# Patient Record
Sex: Female | Born: 1995 | Race: Black or African American | Hispanic: No | Marital: Single | Smoking: Never smoker
Health system: Southern US, Community
[De-identification: ages and names within clinical notes are randomized; demographics above are authoritative.]

---
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
## 2016-01-06 ENCOUNTER — Emergency Department (HOSPITAL_COMMUNITY)
Admission: EM | Admit: 2016-01-06 | Discharge: 2016-01-06 | Disposition: A | Discharge status: Home / Self Care | Payer: Medicaid Other | Attending: Emergency Medicine | Admitting: Emergency Medicine

## 2016-01-06 ENCOUNTER — Encounter (HOSPITAL_COMMUNITY): Payer: Self-pay | Admitting: Emergency Medicine

## 2016-01-06 ENCOUNTER — Emergency Department (HOSPITAL_COMMUNITY): Payer: Medicaid Other

## 2016-01-06 DIAGNOSIS — R05 Cough: Secondary | ICD-10-CM | POA: Diagnosis present

## 2016-01-06 DIAGNOSIS — Z793 Long term (current) use of hormonal contraceptives: Secondary | ICD-10-CM | POA: Insufficient documentation

## 2016-01-06 DIAGNOSIS — J069 Acute upper respiratory infection, unspecified: Secondary | ICD-10-CM

## 2016-01-06 DIAGNOSIS — B9789 Other viral agents as the cause of diseases classified elsewhere: Secondary | ICD-10-CM

## 2016-01-06 LAB — RAPID STREP SCREEN (MED CTR MEBANE ONLY): Streptococcus, Group A Screen (Direct): NEGATIVE

## 2016-01-06 MED ORDER — GUAIFENESIN ER 1200 MG PO TB12: 1.0000 | ORAL_TABLET | Freq: Two times a day (BID) | ORAL | Status: DC

## 2016-01-06 MED ORDER — PROMETHAZINE-DM 6.25-15 MG/5ML PO SYRP
5.0000 mL | ORAL_SOLUTION | Freq: Four times a day (QID) | ORAL | Status: DC | PRN
Start: 2016-01-06 — End: 2021-04-20

## 2016-01-06 MED ORDER — IBUPROFEN 800 MG PO TABS: 800.0000 mg | ORAL_TABLET | Freq: Three times a day (TID) | ORAL | Status: DC | PRN

## 2016-01-06 NOTE — ED Provider Notes (Signed)
CSN: [id number]     Arrival date & time 01/06/16  1626 History   By signing my name below, I, Lorenza Chick, attest that this documentation has been prepared under the direction and in the presence of Ebbie Ridge, PA-C  Electronically Signed: Lorenza Chick, ED Scribe. 01/06/2016. 5:15 PM.  Chief Complaint  Patient presents with  . Sore Throat  . Cough  . Otalgia   The history is provided by the patient. No language interpreter was used.    HPI Comments: Amy Richard is a 20 y.o. female who presents to the Emergency Department complaining of a productive cough with green sputum worse over the last week. Associated symptoms are chest tightness, congestion, rhinorrhea, a feeling of ear fullness and sore throat. Her pain is worse when swallowing . Pt states that she began having similar symptoms two weeks ago but symptoms improved with robitussin and motrin. She also notes that her symptoms began to worsen again earlier this week following a trip to [place]. Pt denies vomiting, nausea, chest pain and palpitations.  History reviewed. No pertinent past medical history. History reviewed. No pertinent past surgical history. No family history on file. Social History  Substance Use Topics  . Smoking status: Never Smoker   . Smokeless tobacco: None  . Alcohol Use: No   OB History    No data available     Review of Systems  A complete 10 system review of systems was obtained and all systems are negative except as noted in the HPI and PMH.    Allergies  Review of patient's allergies indicates no known allergies.  Home Medications   Prior to Admission medications   Medication Sig Start Date End Date Taking? Authorizing Provider  cetirizine (ZYRTEC) 10 MG tablet Take 1 tablet (10 mg total) by mouth daily. 10/31/13 12/29/13  Truddie Coco, DO  norgestimate-ethinyl estradiol (SPRINTEC 28) 0.25-35 MG-MCG tablet Take 1 tablet by mouth daily.    Historical Provider, MD   BP 132/79 mmHg  Pulse  80  Temp(Src) 98.2 F (36.8 C) (Oral)  Resp 16  SpO2 100%  LMP 12/25/2015 Physical Exam  Constitutional: She is oriented to person, place, and time. She appears well-developed and well-nourished. No distress.  HENT:  Head: Normocephalic and atraumatic.  Mouth/Throat: Oropharynx is clear and moist. No oropharyngeal exudate.  Eyes: Conjunctivae and EOM are normal.  Neck: Neck supple. No tracheal deviation present.  Cardiovascular: Normal rate, regular rhythm and normal heart sounds.   Pulmonary/Chest: Effort normal and breath sounds normal.  Abdominal: Soft. There is no tenderness. There is no rebound, no guarding and no CVA tenderness.  Musculoskeletal: Normal range of motion.  Neurological: She is alert and oriented to person, place, and time.  Skin: Skin is warm and dry.  Psychiatric: She has a normal mood and affect. Her behavior is normal.  Nursing note and vitals reviewed.   ED Course  Procedures (including critical care time)  DIAGNOSTIC STUDIES: Oxygen Saturation is 100% on RA, noraml by my interpretation.    COORDINATION OF CARE: 4:41 PM-Discussed treatment plan with pt at bedside and pt agreed to plan.   Labs Review Labs Reviewed  RAPID STREP SCREEN (NOT AT [facility])  CULTURE, GROUP A STREP [facility])    Imaging Review Dg Chest 2 View  01/06/2016  CLINICAL DATA:  Central chest pain, heart productive cough with green and bloody mucus, and shortness of breath for 1 week EXAM: CHEST  2 VIEW COMPARISON:  None FINDINGS: Normal heart  size, mediastinal contours and pulmonary vascularity. Lungs clear. No pleural effusion or pneumothorax. Bones unremarkable. IMPRESSION: No acute abnormalities. Electronically Signed   By: Ulyses SouthwardMark  Boles M.D.   On: 01/06/2016 17:31   I have personally reviewed and evaluated these images and lab results as part of my medical decision-making.   MDM  Pt symptoms consistent with URI. CXR negative for acute infiltrate. Pt will be discharged with symptomatic  treatment.  Discussed return precautions.  Pt is hemodynamically stable & in NAD prior to discharge.  Final diagnoses:  None      Charlestine NightChristopher Candida Vetter, PA-C 01/06/16 1737  Geoffery Lyonsouglas Delo, MD 01/06/16 2148

## 2016-01-06 NOTE — Discharge Instructions (Signed)
Return here as needed.  Follow-up with your primary care doctor.  The x-rays did not show any abnormality and your strep test was negative.  Increase her fluid intake and rest as much as possible

## 2016-01-06 NOTE — ED Notes (Signed)
Pt c/o sore throat, cough, and bilateral otalgia x 2 weeks. Pt sts she thought she was getting better but then began to have a productive cough. Pt c/o chest pain with coughing, deep breaths, and movement. A&Ox4 and ambulatory. Pt c/o painful swallowing.

## 2016-01-09 LAB — CULTURE, GROUP A STREP (THRC)

## 2016-02-03 ENCOUNTER — Emergency Department (HOSPITAL_COMMUNITY)
Admission: EM | Admit: 2016-02-03 | Discharge: 2016-02-03 | Disposition: A | Discharge status: Home / Self Care | Payer: Medicaid Other | Attending: Emergency Medicine | Admitting: Emergency Medicine

## 2016-02-03 ENCOUNTER — Encounter (HOSPITAL_COMMUNITY): Payer: Self-pay

## 2016-02-03 DIAGNOSIS — S6991XA Unspecified injury of right wrist, hand and finger(s), initial encounter: Secondary | ICD-10-CM | POA: Insufficient documentation

## 2016-02-03 DIAGNOSIS — B9689 Other specified bacterial agents as the cause of diseases classified elsewhere: Secondary | ICD-10-CM

## 2016-02-03 DIAGNOSIS — L293 Anogenital pruritus, unspecified: Secondary | ICD-10-CM | POA: Diagnosis present

## 2016-02-03 DIAGNOSIS — Z79818 Long term (current) use of other agents affecting estrogen receptors and estrogen levels: Secondary | ICD-10-CM | POA: Diagnosis not present

## 2016-02-03 DIAGNOSIS — B373 Candidiasis of vulva and vagina: Secondary | ICD-10-CM | POA: Insufficient documentation

## 2016-02-03 DIAGNOSIS — Y9389 Activity, other specified: Secondary | ICD-10-CM | POA: Diagnosis not present

## 2016-02-03 DIAGNOSIS — Z3202 Encounter for pregnancy test, result negative: Secondary | ICD-10-CM | POA: Diagnosis not present

## 2016-02-03 DIAGNOSIS — Z79899 Other long term (current) drug therapy: Secondary | ICD-10-CM | POA: Insufficient documentation

## 2016-02-03 DIAGNOSIS — Y9289 Other specified places as the place of occurrence of the external cause: Secondary | ICD-10-CM | POA: Diagnosis not present

## 2016-02-03 DIAGNOSIS — Y998 Other external cause status: Secondary | ICD-10-CM | POA: Diagnosis not present

## 2016-02-03 DIAGNOSIS — N760 Acute vaginitis: Secondary | ICD-10-CM | POA: Diagnosis not present

## 2016-02-03 DIAGNOSIS — B379 Candidiasis, unspecified: Secondary | ICD-10-CM

## 2016-02-03 LAB — URINALYSIS, ROUTINE W REFLEX MICROSCOPIC
Bilirubin Urine: NEGATIVE
Glucose, UA: NEGATIVE mg/dL
Hgb urine dipstick: NEGATIVE
Ketones, ur: NEGATIVE mg/dL
Nitrite: NEGATIVE
Protein, ur: 30 mg/dL — AB
Specific Gravity, Urine: 1.026 (ref 1.005–1.030)
pH: 6.5 (ref 5.0–8.0)

## 2016-02-03 LAB — URINE MICROSCOPIC-ADD ON

## 2016-02-03 LAB — WET PREP, GENITAL
Sperm: NONE SEEN
Trich, Wet Prep: NONE SEEN

## 2016-02-03 LAB — POC URINE PREG, ED: Preg Test, Ur: NEGATIVE

## 2016-02-03 MED ORDER — FLUCONAZOLE 150 MG PO TABS: 150.0000 mg | ORAL_TABLET | Freq: Once | ORAL | Status: DC

## 2016-02-03 MED ORDER — METRONIDAZOLE 500 MG PO TABS: 500.0000 mg | ORAL_TABLET | Freq: Two times a day (BID) | ORAL | Status: DC
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
## 2016-02-03 NOTE — Discharge Instructions (Signed)
Take the prescribed medication as directed.  Do not drink alcohol while taking Flagyl, it will make you very sick. Follow-up with your primary care physician. Return to the ED for new or worsening symptoms.
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
## 2016-02-03 NOTE — ED Notes (Signed)
Pt is unable to give a urine sample 

## 2016-02-06 LAB — GC/CHLAMYDIA PROBE AMP (~~LOC~~) NOT AT ARMC
Chlamydia: NEGATIVE
Neisseria Gonorrhea: NEGATIVE

## 2016-02-25 ENCOUNTER — Encounter (HOSPITAL_COMMUNITY): Payer: Self-pay | Admitting: Emergency Medicine

## 2016-02-25 DIAGNOSIS — Y999 Unspecified external cause status: Secondary | ICD-10-CM | POA: Insufficient documentation

## 2016-02-25 DIAGNOSIS — Y93F2 Activity, caregiving, lifting: Secondary | ICD-10-CM | POA: Diagnosis not present

## 2016-02-25 DIAGNOSIS — X500XXA Overexertion from strenuous movement or load, initial encounter: Secondary | ICD-10-CM | POA: Insufficient documentation

## 2016-02-25 DIAGNOSIS — Y929 Unspecified place or not applicable: Secondary | ICD-10-CM | POA: Diagnosis not present

## 2016-02-25 DIAGNOSIS — S61304A Unspecified open wound of right ring finger with damage to nail, initial encounter: Secondary | ICD-10-CM | POA: Insufficient documentation

## 2016-02-25 DIAGNOSIS — Z792 Long term (current) use of antibiotics: Secondary | ICD-10-CM | POA: Insufficient documentation

## 2016-02-25 NOTE — ED Notes (Addendum)
Pt was here on the 5th for right ring fingernail lifting up. Pt re injured it while lifting a box. Pts nail is lifted from bed. NO bleeding noted.

## 2016-02-26 ENCOUNTER — Emergency Department (HOSPITAL_COMMUNITY)
Admission: EM | Admit: 2016-02-26 | Discharge: 2016-02-26 | Disposition: A | Discharge status: Home / Self Care | Payer: Medicaid Other | Attending: Emergency Medicine | Admitting: Emergency Medicine

## 2016-02-26 DIAGNOSIS — S6981XA Other specified injuries of right wrist, hand and finger(s), initial encounter: Secondary | ICD-10-CM

## 2016-02-26 MED ORDER — LIDOCAINE HCL (PF) 1 % IJ SOLN
5.0000 mL | INTRAMUSCULAR | Status: AC
  Administered 2016-02-26: 5 mL
  Filled 2016-02-26: qty 30

## 2016-02-26 NOTE — ED Notes (Signed)
PA at bedside.

## 2016-02-26 NOTE — ED Provider Notes (Signed)
CSN: [id number]     Arrival date & time 02/25/16  2242 History   First MD Initiated Contact with Patient 02/26/16 0021     Chief Complaint  Patient presents with  . Nail Problem     (Consider location/radiation/quality/duration/timing/severity/associated sxs/prior Treatment) HPI Comments: 20 year old female presents to the emergency department for evaluation of nail bed injury. Patient states that the nail of her right ring finger was loose from an injury on 02/03/2016. She states that she reinjured this nail while lifting a box today causing the nail to lift from her nail bed. She complains of a mild, aching, throbbing pain which has been constant. Pain is worse with palpation to the area. No associated laceration. No medications taken prior to arrival for symptoms.  The history is provided by the patient. No language interpreter was used.    History reviewed. No pertinent past medical history. History reviewed. No pertinent past surgical history. History reviewed. No pertinent family history. Social History  Substance Use Topics  . Smoking status: Never Smoker   . Smokeless tobacco: None  . Alcohol Use: No   OB History    No data available      Review of Systems  Skin: Positive for wound.  Neurological: Negative for weakness and numbness.  All other systems reviewed and are negative.   Allergies  Review of patient's allergies indicates no known allergies.  Home Medications   Prior to Admission medications   Medication Sig Start Date End Date Taking? Authorizing Provider  Norethindrone-Ethinyl Estradiol-Fe Biphas (LO LOESTRIN FE) 1 MG-10 MCG / 10 MCG tablet Take 1 tablet by mouth daily.   Yes Historical Provider, MD  fluconazole (DIFLUCAN) 150 MG tablet Take 1 tablet (150 mg total) by mouth once. Repeat in 72 hours if needed. 02/03/16   Garlon HatchetLisa M Sanders, PA-C  Guaifenesin 1200 MG TB12 Take 1 tablet (1,200 mg total) by mouth 2 (two) times daily. 01/06/16   Charlestine Nighthristopher Lawyer,  PA-C  ibuprofen (ADVIL,MOTRIN) 800 MG tablet Take 1 tablet (800 mg total) by mouth every 8 (eight) hours as needed. Patient taking differently: Take 800 mg by mouth every 8 (eight) hours as needed for headache.  01/06/16   Charlestine Nighthristopher Lawyer, PA-C  metroNIDAZOLE (FLAGYL) 500 MG tablet Take 1 tablet (500 mg total) by mouth 2 (two) times daily. 02/03/16   Garlon HatchetLisa M Sanders, PA-C  promethazine-dextromethorphan (PROMETHAZINE-DM) 6.25-15 MG/5ML syrup Take 5 mLs by mouth 4 (four) times daily as needed for cough. 01/06/16   Christopher Lawyer, PA-C   BP 117/76 mmHg  Pulse 91  Temp(Src) 97.7 F (36.5 C) (Oral)  Resp 15  SpO2 98%  LMP 02/03/2016   Physical Exam  Constitutional: She is oriented to person, place, and time. She appears well-developed and well-nourished. No distress.  HENT:  Head: Normocephalic and atraumatic.  Eyes: Conjunctivae and EOM are normal. No scleral icterus.  Neck: Normal range of motion.  Cardiovascular: Normal rate, regular rhythm and intact distal pulses.   Distal radial pulse 2+ in the right upper extremity. Capillary refill brisk in all digits of right hand.  Pulmonary/Chest: Effort normal. No respiratory distress.  Musculoskeletal: Normal range of motion.       Right hand: She exhibits tenderness. She exhibits normal range of motion, normal capillary refill and no swelling. Normal sensation noted. Normal strength noted.       Hands: Neurological: She is alert and oriented to person, place, and time. She exhibits normal muscle tone. Coordination normal.  Skin: Skin is warm  and dry. No rash noted. She is not diaphoretic. No erythema. No pallor.  Psychiatric: She has a normal mood and affect. Her behavior is normal.  Nursing note and vitals reviewed.   ED Course  Procedures (including critical care time) Labs Review Labs Reviewed - No data to display  Imaging Review No results found.   I have personally reviewed and evaluated these images and lab results as part of my  medical decision-making.   EKG Interpretation None      LACERATION REPAIR Performed by: Antony Madura Authorized by: Antony Madura Consent: Verbal consent obtained. Risks and benefits: risks, benefits and alternatives were discussed Consent given by: patient Patient identity confirmed: provided demographic data Prepped and Draped in normal sterile fashion Wound explored  Laceration Location: R ring finger  Laceration Length: n/a  No Foreign Bodies seen or palpated  Anesthesia: digital block  Local anesthetic: lidocaine 1% without epinephrine  Anesthetic total: 3 ml  Irrigation method: syringe Amount of cleaning: standard  Skin closure: 4-0 ethilon  Number of sutures: 1  Technique: simple interrupted  Patient tolerance: Patient tolerated the procedure well with no immediate complications.  MDM   Final diagnoses:  Nail bed injury, right, initial encounter    20 year old female presents to the emergency department for evaluation of nail bed injury. Patient neurovascularly intact. Digital block performed to reposition nail back into the nail bed. Fingernail secured with 1 suture. Have discussed outpatient follow-up with hand surgery to ensure proper wound healing and nail regrowth. NSAIDs recommended for pain and return precautions given. Patient discharged in satisfactory condition with no unaddressed concerns.   Filed Vitals:   02/25/16 2258 02/26/16 0023  BP: 128/78 117/76  Pulse: 83 91  Temp: 98.2 F (36.8 C) 97.7 F (36.5 C)  TempSrc: Oral Oral  Resp: 20 15  SpO2: 98% 98%     Antony Madura, PA-C 02/26/16 [id number]  April Palumbo, MD 02/26/16 [phone]

## 2016-02-26 NOTE — Discharge Instructions (Signed)
Nail Bed Injury °The nail bed is the soft tissue under a fingernail or toenail that is the origin for new nail growth. Various types of injuries can occur at the nail bed. These injuries may involve bruising or bleeding under the nail, cuts (lacerations) in the nail or nail bed, or loss of a part of the nail or the whole nail (avulsion). In some cases, a nail bed injury accompanies another injury, such as a break (fracture) of the bone at the tip of the finger or toe. Nail bed injuries are common in people who have jobs that require performing manual tasks with their hands, such as carpenters and landscapers.  °The nail bed includes the growth center of the nail. If this growth center is damaged, the injured nail may not grow back normally if at all. The regrown nail might have an abnormal shape or appearance. It can take several months for a damaged or torn-off nail to regrow. Depending on the nature and extent of the nail bed injury, there may be a permanent disruption of normal nail growth. °CAUSES  °Damage to the nail bed area is usually caused by crushing, pinching, cutting, or tearing injuries of the fingertip or toe. For example, these injuries may occur when a fingertip gets caught in a door, hit by a hammer, or damaged in accidents involving electrical tools or power machinery.  °SYMPTOMS  °Symptoms vary depending on the nature of the injury. Symptoms may include: °· Pain in the injured area. °· Bleeding. °· Swelling. °· Discoloration. °· Collection of blood under the nail (hematoma). °· Deformed or split nail. °· Loose nail (not stuck to the nail bed). °· Loss of all or part of the nail. °DIAGNOSIS  °Your caregiver will take a medical history and examine the injured area. You will be asked to describe how the injury occurred. X-rays may be done to see if you have a fracture. Your caregiver might also check for conditions that may affect healing, such as diabetes, nerve problems, or poor circulation.    °TREATMENT  °Treatment depends on the type of injury. °· The injury may not require any special treatment other than keeping the area clean and free of infection.   °· Your caregiver may drain the collection of blood from under the nail. This can be done by making a small hole in the nail.   °· Your caregiver may remove all or part of your nail. This might be necessary to stitch (suture) any laceration in the nail bed. Before doing this, the caregiver will likely give you medication to numb the nail area (local anesthetic). In some cases, the caregiver may choose to numb the entire finger or toe (digital nerve block). Depending on the location and size of the nail bed injury, an avulsed nail is sometimes stitched back in place to provide temporary protection to the nail bed until the new nail grows in. °· Your caregiver may apply bandages (dressings) or splints to the area. °· You might be prescribed antibiotic medication to help prevent infection. °· For certain injuries, your caregiver may direct you to see a hand or foot specialist.   °You may need a tetanus shot if: °· You cannot remember when you had your last tetanus shot. °· You have never had a tetanus shot. °· The injury broke your skin. °If you get a tetanus shot, your arm may swell, get red, and feel warm to the touch. This is common and not a problem. If you need a tetanus   shot and you choose not to have one, there is a rare chance of getting tetanus. Sickness from tetanus can be serious. °HOME CARE INSTRUCTIONS  °· Keep your hand or foot raised (elevated) to relieve pain and swelling.   °¨ For an injured toenail, lie in bed or on a couch with your leg on pillows. You can also sit in a recliner with your leg up. Avoid walking or letting your leg dangle. When you walk, wear an open-toe shoe.  °¨ For an injured fingernail, keep your hand above the level of your heart. Use pillows on a table or on the arm of your chair while sitting. Use them on your bed  while sleeping.   °· Keep your injury protected with dressings or splints as directed by your caregiver.   °· Keep any dressings clean and dry. Change or remove your dressings as directed by your caregiver.   °· Only take over-the-counter or prescription medications as directed by your caregiver. If you were prescribed antibiotics, take them as directed. Finish them even if you start to feel better.   °· Follow up with your caregiver as directed.   °SEEK MEDICAL CARE IF:  °· You have pain that is not controlled with medication.   °· You have any problems caring for your injury.   °SEEK IMMEDIATE MEDICAL CARE IF:  °· You have increased pain, drainage, or bleeding in the injured area.   °· You have redness, soreness, and swelling (inflammation) in the injured area. °· You have a fever or persistent symptoms for more than 2-3 days. °· You have a fever and your symptoms suddenly get worse. °· You have swelling that spreads from your finger into your hand or from your toe into your foot.   °MAKE SURE YOU: °· Understand these instructions. °· Will watch your condition. °· Will get help right away if you are not doing well or get worse. °  °This information is not intended to replace advice given to you by your health care provider. Make sure you discuss any questions you have with your health care provider. °  °Document Released: 10/25/2004 Document Revised: 01/12/2013 Document Reviewed: 10/09/2012 °Elsevier Interactive Patient Education ©2016 [employer] ° °

## 2017-12-03 ENCOUNTER — Ambulatory Visit (HOSPITAL_COMMUNITY)
Admission: EM | Admit: 2017-12-03 | Discharge: 2017-12-03 | Disposition: A | Discharge status: Home / Self Care | Payer: Self-pay | Attending: Family Medicine | Admitting: Family Medicine

## 2017-12-03 ENCOUNTER — Other Ambulatory Visit: Payer: Self-pay

## 2017-12-03 ENCOUNTER — Encounter (HOSPITAL_COMMUNITY): Payer: Self-pay | Admitting: Emergency Medicine

## 2017-12-03 DIAGNOSIS — R040 Epistaxis: Principal | ICD-10-CM

## 2017-12-03 MED ORDER — OXYMETAZOLINE HCL 0.05 % NA SOLN
1.0000 | Freq: Two times a day (BID) | NASAL | Status: DC
  Administered 2017-12-03: 1 via NASAL

## 2017-12-03 MED ORDER — OXYMETAZOLINE HCL 0.05 % NA SOLN
NASAL | Status: AC
  Filled 2017-12-03: qty 15

## 2017-12-03 NOTE — ED Provider Notes (Signed)
[facility]    CSN: [id number] Arrival date & time: 12/03/17  1614     History   Chief Complaint Chief Complaint  Patient presents with  . Epistaxis    HPI Amy Richard is a 22 y.o. female history of allergic rhinitis presenting today with nosebleeds.  She states that 3 days ago she started to have a dry cough and itchy throat prompting her to start a daily Claritin pill.  For the past 2 days she has had approximately 2-3 nosebleeds a day.  States that it is mainly been coming from the right nostril, will come into the mouth when plugging up her left nostril.  HPI  History reviewed. No pertinent past medical history.  There are no active problems to display for this patient.   History reviewed. No pertinent surgical history.  OB History    No data available       Home Medications    Prior to Admission medications   Medication Sig Start Date End Date Taking? Authorizing Provider  loratadine (CLARITIN) 10 MG tablet Take 10 mg by mouth daily.   Yes [provider]  norethindrone-ethinyl estradiol (MICROGESTIN,JUNEL,LOESTRIN) 1-20 MG-MCG tablet Take 1 tablet by mouth daily.   Yes [provider]    Family History No family history on file.  Social History Social History   Tobacco Use  . Smoking status: Never Smoker  . Smokeless tobacco: Never Used  Substance Use Topics  . Alcohol use: No    Frequency: Never  . Drug use: No     Allergies   Patient has no known allergies.   Review of Systems Review of Systems  Constitutional: Negative for chills, fatigue and fever.  HENT: Positive for congestion, ear pain, rhinorrhea, sinus pressure and sore throat. Negative for trouble swallowing.   Respiratory: Positive for cough. Negative for chest tightness and shortness of breath.   Cardiovascular: Negative for chest pain.  Gastrointestinal: Negative for abdominal pain, nausea and vomiting.  Musculoskeletal: Negative for myalgias.    Skin: Negative for rash.  Neurological: Negative for dizziness, light-headedness and headaches.     Physical Exam Triage Vital Signs ED Triage Vitals  Enc Vitals Group     BP 12/03/17 1630 113/80     Pulse Rate 12/03/17 1630 (!) 104     Resp --      Temp 12/03/17 1630 98.9 F (37.2 C)     Temp Source 12/03/17 1630 Oral     SpO2 12/03/17 1630 97 %     Weight 12/03/17 1629 165 lb (74.8 kg)     Height --      Head Circumference --      Peak Flow --      Pain Score --      Pain Loc --      Pain Edu? --      Excl. in GC? --    No data found.  Updated Vital Signs BP 113/80 (BP Location: Left Arm)   Pulse (!) 104   Temp 98.9 F (37.2 C) (Oral)   Wt 165 lb (74.8 kg)   LMP 11/04/2017   SpO2 97%   Visual Acuity Right Eye Distance:   Left Eye Distance:   Bilateral Distance:    Right Eye Near:   Left Eye Near:    Bilateral Near:     Physical Exam  Constitutional: She appears well-developed and well-nourished. No distress.  HENT:  Head: Normocephalic and atraumatic.  Bilateral TMs nonerythematous  Nasal  mucosa erythematous with swollen turbinates mucosa on septum appears mildly irritated bilaterally, no source of bleeding visualized near Kiesselbach's plexus  Eyes: Conjunctivae are normal.  Neck: Neck supple.  Cardiovascular: Normal rate and regular rhythm.  No murmur heard. Pulmonary/Chest: Effort normal and breath sounds normal. No respiratory distress.  Musculoskeletal: She exhibits no edema.  Neurological: She is alert.  Skin: Skin is warm and dry.  Psychiatric: She has a normal mood and affect.  Nursing note and vitals reviewed.    UC Treatments / Results  Labs (all labs ordered are listed, but only abnormal results are displayed) Labs Reviewed - No data to display  EKG  EKG Interpretation None       Radiology No results found.  Procedures Procedures (including critical care time)  Medications Ordered in UC Medications - No data to  display   Initial Impression / Assessment and Plan / UC Course  I have reviewed the triage vital signs and the nursing notes.  Pertinent labs & imaging results that were available during my care of the patient were reviewed by me and considered in my medical decision making (see chart for details).    Patient with epistaxis, nose began bleeding just prior to d/c provided afrin bilaterally. Continue afrin over next 2 days, instructed on limiting use to 3 days due to rebound congestion. Continue humidifier, Claritin. Discussed strict return precautions. Patient verbalized understanding and is agreeable with plan.   Final Clinical Impressions(s) / UC Diagnoses   Final diagnoses:  Epistaxis    ED Discharge Orders    None       Controlled Substance Prescriptions [place] Controlled Substance Registry consulted? Not Applicable   Lew DawesWieters, Nyree Applegate C, [place] JerseyPA-C 12/03/17 1713

## 2017-12-03 NOTE — ED Triage Notes (Signed)
States having nose bleeds onset 2 days and "itchy, scratchy" throat, has Hx seasonal allergies

## 2017-12-03 NOTE — Discharge Instructions (Signed)
Please pick up Afrin/oxymetolazine nasal spray to use to stop bleeding, you may use twice a day for the next 2 days.  Do not use more than 3 days as this may cause congestion to worsen.  Please continue to use humidifier and continue Claritin- if nose bleeds persisting, consider stopping Claritin as it can cause drying.  Please return if nosebleeds not pain controlled within 20 minutes, becoming more frequent, persisting beyond a week.

## 2019-05-27 ENCOUNTER — Other Ambulatory Visit: Payer: Self-pay

## 2019-05-27 ENCOUNTER — Emergency Department (HOSPITAL_COMMUNITY)
Admission: EM | Admit: 2019-05-27 | Discharge: 2019-05-27 | Disposition: A | Discharge status: Home / Self Care | Payer: 59 | Attending: Emergency Medicine | Admitting: Emergency Medicine

## 2019-05-27 ENCOUNTER — Encounter (HOSPITAL_COMMUNITY): Payer: Self-pay

## 2019-05-27 DIAGNOSIS — Z20828 Contact with and (suspected) exposure to other viral communicable diseases: Secondary | ICD-10-CM | POA: Diagnosis not present

## 2019-05-27 DIAGNOSIS — Z79899 Other long term (current) drug therapy: Secondary | ICD-10-CM | POA: Diagnosis not present

## 2019-05-27 DIAGNOSIS — F333 Major depressive disorder, recurrent, severe with psychotic symptoms: Secondary | ICD-10-CM | POA: Insufficient documentation

## 2019-05-27 DIAGNOSIS — R45851 Suicidal ideations: Secondary | ICD-10-CM | POA: Insufficient documentation

## 2019-05-27 LAB — CBC
HCT: 41.1 % (ref 36.0–46.0)
Hemoglobin: 13 g/dL (ref 12.0–15.0)
MCH: 27.3 pg (ref 26.0–34.0)
MCHC: 31.6 g/dL (ref 30.0–36.0)
MCV: 86.2 fL (ref 80.0–100.0)
Platelets: 303 10*3/uL (ref 150–400)
RBC: 4.77 MIL/uL (ref 3.87–5.11)
RDW: 11.9 % (ref 11.5–15.5)
WBC: 7.5 10*3/uL (ref 4.0–10.5)
nRBC: 0 % (ref 0.0–0.2)

## 2019-05-27 LAB — COMPREHENSIVE METABOLIC PANEL
ALT: 17 U/L (ref 0–44)
AST: 17 U/L (ref 15–41)
Albumin: 4.6 g/dL (ref 3.5–5.0)
Alkaline Phosphatase: 46 U/L (ref 38–126)
Anion gap: 7 (ref 5–15)
BUN: 10 mg/dL (ref 6–20)
CO2: 24 mmol/L (ref 22–32)
Calcium: 9.3 mg/dL (ref 8.9–10.3)
Chloride: 104 mmol/L (ref 98–111)
Creatinine, Ser: 0.75 mg/dL (ref 0.44–1.00)
GFR calc Af Amer: >60 mL/min (ref >60)
GFR calc non Af Amer: >60 mL/min (ref >60)
Glucose, Bld: 102 mg/dL — ABNORMAL HIGH (ref 70–99)
Potassium: 3.6 mmol/L (ref 3.5–5.1)
Sodium: 135 mmol/L (ref 135–145)
Total Bilirubin: 0.3 mg/dL (ref 0.3–1.2)
Total Protein: 8.4 g/dL — ABNORMAL HIGH (ref 6.5–8.1)

## 2019-05-27 LAB — SARS CORONAVIRUS 2 (TAT 6-24 HRS): SARS Coronavirus 2: NEGATIVE

## 2019-05-27 LAB — ETHANOL: Alcohol, Ethyl (B): <10 mg/dL (ref <10)

## 2019-05-27 LAB — RAPID URINE DRUG SCREEN, HOSP PERFORMED
Amphetamines: NOT DETECTED
Barbiturates: NOT DETECTED
Benzodiazepines: NOT DETECTED
Cocaine: NOT DETECTED
Opiates: NOT DETECTED
Tetrahydrocannabinol: NOT DETECTED

## 2019-05-27 LAB — ACETAMINOPHEN LEVEL: Acetaminophen (Tylenol), Serum: <10 ug/mL — ABNORMAL LOW (ref 10–30)

## 2019-05-27 LAB — I-STAT BETA HCG BLOOD, ED (MC, WL, AP ONLY): I-stat hCG, quantitative: <5 m[IU]/mL (ref <5)

## 2019-05-27 LAB — SALICYLATE LEVEL: Salicylate Lvl: <7 mg/dL (ref 2.8–30.0)

## 2019-05-27 NOTE — [facility] Counselor (Signed)
Per S. Rankin, NP, Pt is now psych-cleared.

## 2019-05-27 NOTE — Discharge Instructions (Signed)
See your counselor   See your medical doctor   Return to ER if you have plans to kill yourself or others, hallucinations
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
## 2019-05-27 NOTE — ED Provider Notes (Signed)
  Physical Exam  BP 110/70   Pulse 84   Temp 98.4 F (36.9 C) (Oral)   Resp 16   Ht 5' (1.524 m)   Wt 74.8 kg   SpO2 98%   BMI 32.22 kg/m   Physical Exam  ED Course/Procedures     Procedures  MDM  Patient cleared by psych. Stable for discharge      Drenda Freeze, MD 05/27/19 [phone]

## 2019-05-27 NOTE — ED Notes (Signed)
Lunch food tray given.

## 2019-05-27 NOTE — ED Notes (Signed)
No respiratory or acute distress noted alert and oriented x 3 sitter watching pt from door.
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
## 2019-05-28 ENCOUNTER — Encounter (HOSPITAL_COMMUNITY): Payer: Self-pay | Admitting: Emergency Medicine

## 2019-09-25 ENCOUNTER — Emergency Department (HOSPITAL_COMMUNITY)
Admission: EM | Admit: 2019-09-25 | Discharge: 2019-09-25 | Disposition: A | Discharge status: Home / Self Care | Payer: 59 | Attending: Emergency Medicine | Admitting: Emergency Medicine

## 2019-09-25 ENCOUNTER — Emergency Department (HOSPITAL_COMMUNITY): Payer: 59

## 2019-09-25 ENCOUNTER — Other Ambulatory Visit: Payer: Self-pay

## 2019-09-25 ENCOUNTER — Encounter (HOSPITAL_COMMUNITY): Payer: Self-pay

## 2019-09-25 DIAGNOSIS — R1012 Left upper quadrant pain: Secondary | ICD-10-CM | POA: Insufficient documentation

## 2019-09-25 DIAGNOSIS — M791 Myalgia, unspecified site: Secondary | ICD-10-CM | POA: Insufficient documentation

## 2019-09-25 DIAGNOSIS — Y93I9 Activity, other involving external motion: Secondary | ICD-10-CM | POA: Diagnosis not present

## 2019-09-25 DIAGNOSIS — Y92414 Local residential or business street as the place of occurrence of the external cause: Secondary | ICD-10-CM | POA: Insufficient documentation

## 2019-09-25 DIAGNOSIS — Y999 Unspecified external cause status: Secondary | ICD-10-CM | POA: Insufficient documentation

## 2019-09-25 LAB — CBC WITH DIFFERENTIAL/PLATELET
Abs Immature Granulocytes: 0.02 10*3/uL (ref 0.00–0.07)
Basophils Absolute: 0 10*3/uL (ref 0.0–0.1)
Basophils Relative: 1 %
Eosinophils Absolute: 0.1 10*3/uL (ref 0.0–0.5)
Eosinophils Relative: 3 %
HCT: 41 % (ref 36.0–46.0)
Hemoglobin: 12.8 g/dL (ref 12.0–15.0)
Immature Granulocytes: 0 %
Lymphocytes Relative: 35 %
Lymphs Abs: 1.8 10*3/uL (ref 0.7–4.0)
MCH: 26.9 pg (ref 26.0–34.0)
MCHC: 31.2 g/dL (ref 30.0–36.0)
MCV: 86.3 fL (ref 80.0–100.0)
Monocytes Absolute: 0.4 10*3/uL (ref 0.1–1.0)
Monocytes Relative: 9 %
Neutro Abs: 2.8 10*3/uL (ref 1.7–7.7)
Neutrophils Relative %: 52 %
Platelets: 298 10*3/uL (ref 150–400)
RBC: 4.75 MIL/uL (ref 3.87–5.11)
RDW: 12 % (ref 11.5–15.5)
WBC: 5.2 10*3/uL (ref 4.0–10.5)
nRBC: 0 % (ref 0.0–0.2)

## 2019-09-25 LAB — BASIC METABOLIC PANEL
Anion gap: 9 (ref 5–15)
BUN: 8 mg/dL (ref 6–20)
CO2: 23 mmol/L (ref 22–32)
Calcium: 9.5 mg/dL (ref 8.9–10.3)
Chloride: 105 mmol/L (ref 98–111)
Creatinine, Ser: 0.55 mg/dL (ref 0.44–1.00)
GFR calc Af Amer: >60 mL/min (ref >60)
GFR calc non Af Amer: >60 mL/min (ref >60)
Glucose, Bld: 101 mg/dL — ABNORMAL HIGH (ref 70–99)
Potassium: 3.5 mmol/L (ref 3.5–5.1)
Sodium: 137 mmol/L (ref 135–145)

## 2019-09-25 LAB — I-STAT BETA HCG BLOOD, ED (MC, WL, AP ONLY): I-stat hCG, quantitative: <5 m[IU]/mL (ref <5)

## 2019-09-25 MED ORDER — CYCLOBENZAPRINE HCL 10 MG PO TABS
10.0000 mg | ORAL_TABLET | Freq: Once | ORAL | Status: AC
  Administered 2019-09-25: 10 mg via ORAL
  Filled 2019-09-25: qty 1

## 2019-09-25 MED ORDER — SODIUM CHLORIDE (PF) 0.9 % IJ SOLN
INTRAMUSCULAR | Status: AC
  Filled 2019-09-25: qty 50

## 2019-09-25 MED ORDER — ACETAMINOPHEN 325 MG PO TABS
650.0000 mg | ORAL_TABLET | Freq: Once | ORAL | Status: AC
  Administered 2019-09-25: 650 mg via ORAL
  Filled 2019-09-25: qty 2

## 2019-09-25 MED ORDER — CYCLOBENZAPRINE HCL 10 MG PO TABS: 10.0000 mg | ORAL_TABLET | Freq: Two times a day (BID) | ORAL | 0 refills | Status: DC | PRN

## 2019-09-25 MED ORDER — IBUPROFEN 600 MG PO TABS: 600.0000 mg | ORAL_TABLET | Freq: Four times a day (QID) | ORAL | 0 refills | Status: DC | PRN

## 2019-09-25 MED ORDER — IOHEXOL 300 MG/ML  SOLN
100.0000 mL | Freq: Once | INTRAMUSCULAR | Status: AC | PRN
  Administered 2019-09-25: 100 mL via INTRAVENOUS

## 2019-09-25 NOTE — ED Notes (Signed)
X-ray at bedside

## 2019-09-25 NOTE — ED Notes (Signed)
IV team at bedside for U/S IV

## 2019-09-25 NOTE — ED Notes (Signed)
Pt ambulatory to CT
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
## 2019-09-25 NOTE — ED Notes (Signed)
Pt verbalizes understanding of DC instructions. Pt belongings returned and is ambulatory out of ED.  

## 2019-09-25 NOTE — ED Notes (Signed)
Trifan MD at bedside 
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
## 2020-09-01 DIAGNOSIS — Z3A01 Less than 8 weeks gestation of pregnancy: Secondary | ICD-10-CM | POA: Diagnosis not present

## 2020-09-01 DIAGNOSIS — O3680X0 Pregnancy with inconclusive fetal viability, not applicable or unspecified: Secondary | ICD-10-CM | POA: Diagnosis not present

## 2020-09-05 DIAGNOSIS — O219 Vomiting of pregnancy, unspecified: Secondary | ICD-10-CM | POA: Diagnosis not present

## 2020-09-05 DIAGNOSIS — Z3A01 Less than 8 weeks gestation of pregnancy: Secondary | ICD-10-CM | POA: Diagnosis not present

## 2020-09-13 DIAGNOSIS — O3680X0 Pregnancy with inconclusive fetal viability, not applicable or unspecified: Secondary | ICD-10-CM | POA: Diagnosis not present

## 2020-09-13 DIAGNOSIS — E669 Obesity, unspecified: Secondary | ICD-10-CM | POA: Diagnosis not present

## 2020-09-13 DIAGNOSIS — Z3689 Encounter for other specified antenatal screening: Secondary | ICD-10-CM | POA: Diagnosis not present

## 2020-09-13 DIAGNOSIS — O99211 Obesity complicating pregnancy, first trimester: Secondary | ICD-10-CM | POA: Diagnosis not present

## 2020-09-13 DIAGNOSIS — Z23 Encounter for immunization: Secondary | ICD-10-CM | POA: Diagnosis not present

## 2020-09-13 DIAGNOSIS — Z3A08 8 weeks gestation of pregnancy: Secondary | ICD-10-CM | POA: Diagnosis not present

## 2020-09-13 LAB — OB RESULTS CONSOLE GC/CHLAMYDIA
Chlamydia: POSITIVE
Gonorrhea: NEGATIVE

## 2020-09-13 LAB — OB RESULTS CONSOLE RUBELLA ANTIBODY, IGM: Rubella: IMMUNE

## 2020-09-13 LAB — OB RESULTS CONSOLE RPR: RPR: NONREACTIVE

## 2020-09-13 LAB — OB RESULTS CONSOLE ABO/RH: RH Type: POSITIVE

## 2020-09-13 LAB — HEPATITIS C ANTIBODY: HCV Ab: NEGATIVE

## 2020-09-13 LAB — OB RESULTS CONSOLE ANTIBODY SCREEN
Antibody Screen: NEGATIVE
Antibody Screen: NEGATIVE

## 2020-09-13 LAB — OB RESULTS CONSOLE HEPATITIS B SURFACE ANTIGEN: Hepatitis B Surface Ag: NEGATIVE

## 2020-09-13 LAB — SICKLE CELL SCREEN: Sickle Cell Screen: NEGATIVE

## 2020-09-13 LAB — OB RESULTS CONSOLE HIV ANTIBODY (ROUTINE TESTING): HIV: NONREACTIVE

## 2020-10-01 NOTE — L&D Delivery Note (Signed)
OB/GYN Faculty Practice Delivery Note  Amy Richard is a 25 y.o. G1P0 s/p NSVD at [redacted]w[redacted]d. She was admitted for SOL/SROM.   ROM: 6h 13m with clear fluid GBS Status: Negative/-- (05/02 0000) Maximum Maternal Temperature: 56F  Labor Progress: Initial SVE: 6/100/-2. She then progressed to complete.   Delivery Date/Time: 04/18/2021 at 17:24 Delivery: Called to room and patient was complete. Pushed twice. Head delivered LOA. No nuchal cord present. Shoulder and body delivered in usual fashion. Infant with spontaneous cry, placed on mother's abdomen, dried and stimulated. Cord clamped x 2 after 1-minute delay, and cut by FOB. Cord blood drawn. Placenta delivered spontaneously with gentle cord traction. Fundus firm with massage and Pitocin. Labia, perineum, vagina, and cervix inspected inspected with intact perineum, however a clitoral laceration noted .  Baby Weight: pending  Placenta: Sent to L&D Complications: None Lacerations: Clitoral, repaired with 4-0 monocryl, foley catheter placed before repair and will remain in place for 12 hours. QBL: 150 mL, plus an additional ~200cc (waiting on Triton for this) additional after check with clots. 1g of TXA added. Analgesia: Epidural   Infant:  APGAR (1 MIN):  8 APGAR (5 MINS):  9 APGAR (10 MINS):     Jen Mow, DO OB Family Medicine, [facility] for [employer], [facility] 04/18/2021, 5:55 PM

## 2020-10-11 DIAGNOSIS — O99211 Obesity complicating pregnancy, first trimester: Secondary | ICD-10-CM | POA: Diagnosis not present

## 2020-10-11 DIAGNOSIS — Z3682 Encounter for antenatal screening for nuchal translucency: Secondary | ICD-10-CM | POA: Diagnosis not present

## 2020-10-11 DIAGNOSIS — Z3A12 12 weeks gestation of pregnancy: Secondary | ICD-10-CM | POA: Diagnosis not present

## 2020-10-11 DIAGNOSIS — E669 Obesity, unspecified: Secondary | ICD-10-CM | POA: Diagnosis not present

## 2020-10-18 DIAGNOSIS — O9A211 Injury, poisoning and certain other consequences of external causes complicating pregnancy, first trimester: Secondary | ICD-10-CM | POA: Diagnosis not present

## 2020-10-18 DIAGNOSIS — W009XXA Unspecified fall due to ice and snow, initial encounter: Secondary | ICD-10-CM | POA: Diagnosis not present

## 2020-10-18 DIAGNOSIS — O99211 Obesity complicating pregnancy, first trimester: Secondary | ICD-10-CM | POA: Diagnosis not present

## 2020-10-18 DIAGNOSIS — Z3A13 13 weeks gestation of pregnancy: Secondary | ICD-10-CM | POA: Diagnosis not present

## 2020-10-18 DIAGNOSIS — O3680X0 Pregnancy with inconclusive fetal viability, not applicable or unspecified: Secondary | ICD-10-CM | POA: Diagnosis not present

## 2020-10-18 DIAGNOSIS — E669 Obesity, unspecified: Secondary | ICD-10-CM | POA: Diagnosis not present

## 2020-10-18 DIAGNOSIS — T1490XA Injury, unspecified, initial encounter: Secondary | ICD-10-CM | POA: Diagnosis not present

## 2020-11-08 DIAGNOSIS — O4692 Antepartum hemorrhage, unspecified, second trimester: Secondary | ICD-10-CM | POA: Diagnosis not present

## 2020-11-08 DIAGNOSIS — Z3A16 16 weeks gestation of pregnancy: Secondary | ICD-10-CM | POA: Diagnosis not present

## 2020-11-08 DIAGNOSIS — Z3689 Encounter for other specified antenatal screening: Secondary | ICD-10-CM | POA: Diagnosis not present

## 2020-11-16 DIAGNOSIS — Z803 Family history of malignant neoplasm of breast: Secondary | ICD-10-CM | POA: Diagnosis not present

## 2020-11-21 DIAGNOSIS — Z3689 Encounter for other specified antenatal screening: Secondary | ICD-10-CM | POA: Diagnosis not present

## 2020-12-01 DIAGNOSIS — N6022 Fibroadenosis of left breast: Secondary | ICD-10-CM | POA: Diagnosis not present

## 2020-12-01 DIAGNOSIS — D242 Benign neoplasm of left breast: Secondary | ICD-10-CM | POA: Diagnosis not present

## 2020-12-19 DIAGNOSIS — O99891 Other specified diseases and conditions complicating pregnancy: Secondary | ICD-10-CM | POA: Diagnosis not present

## 2020-12-19 DIAGNOSIS — Z8619 Personal history of other infectious and parasitic diseases: Secondary | ICD-10-CM | POA: Diagnosis not present

## 2020-12-19 DIAGNOSIS — Z362 Encounter for other antenatal screening follow-up: Secondary | ICD-10-CM | POA: Diagnosis not present

## 2020-12-19 DIAGNOSIS — N888 Other specified noninflammatory disorders of cervix uteri: Secondary | ICD-10-CM | POA: Diagnosis not present

## 2020-12-19 DIAGNOSIS — O09292 Supervision of pregnancy with other poor reproductive or obstetric history, second trimester: Secondary | ICD-10-CM | POA: Diagnosis not present

## 2020-12-19 DIAGNOSIS — Z3A22 22 weeks gestation of pregnancy: Secondary | ICD-10-CM | POA: Diagnosis not present

## 2020-12-19 DIAGNOSIS — E669 Obesity, unspecified: Secondary | ICD-10-CM | POA: Diagnosis not present

## 2020-12-19 DIAGNOSIS — O99212 Obesity complicating pregnancy, second trimester: Secondary | ICD-10-CM | POA: Diagnosis not present

## 2021-01-30 DIAGNOSIS — O98813 Other maternal infectious and parasitic diseases complicating pregnancy, third trimester: Secondary | ICD-10-CM | POA: Diagnosis not present

## 2021-01-30 DIAGNOSIS — Z3A28 28 weeks gestation of pregnancy: Secondary | ICD-10-CM | POA: Diagnosis not present

## 2021-01-30 DIAGNOSIS — A749 Chlamydial infection, unspecified: Secondary | ICD-10-CM | POA: Diagnosis not present

## 2021-01-30 DIAGNOSIS — Z3689 Encounter for other specified antenatal screening: Secondary | ICD-10-CM | POA: Diagnosis not present

## 2021-01-30 DIAGNOSIS — Z23 Encounter for immunization: Secondary | ICD-10-CM | POA: Diagnosis not present

## 2021-01-30 LAB — OB RESULTS CONSOLE HIV ANTIBODY (ROUTINE TESTING): HIV: NONREACTIVE

## 2021-01-30 LAB — OB RESULTS CONSOLE GBS: GBS: NEGATIVE

## 2021-01-30 LAB — OB RESULTS CONSOLE HGB/HCT, BLOOD: Hemoglobin: 9.8

## 2021-01-30 LAB — OB RESULTS CONSOLE GC/CHLAMYDIA
Chlamydia: POSITIVE
Gonorrhea: NEGATIVE

## 2021-01-30 LAB — OB RESULTS CONSOLE RPR: RPR: NONREACTIVE

## 2021-02-13 DIAGNOSIS — Z3A30 30 weeks gestation of pregnancy: Secondary | ICD-10-CM | POA: Diagnosis not present

## 2021-02-13 DIAGNOSIS — O99213 Obesity complicating pregnancy, third trimester: Secondary | ICD-10-CM | POA: Diagnosis not present

## 2021-02-13 DIAGNOSIS — Z3689 Encounter for other specified antenatal screening: Secondary | ICD-10-CM | POA: Diagnosis not present

## 2021-02-13 DIAGNOSIS — E669 Obesity, unspecified: Secondary | ICD-10-CM | POA: Diagnosis not present

## 2021-03-20 DIAGNOSIS — Z3A35 35 weeks gestation of pregnancy: Secondary | ICD-10-CM | POA: Diagnosis not present

## 2021-03-20 DIAGNOSIS — O99213 Obesity complicating pregnancy, third trimester: Secondary | ICD-10-CM | POA: Diagnosis not present

## 2021-03-20 DIAGNOSIS — Z3689 Encounter for other specified antenatal screening: Secondary | ICD-10-CM | POA: Diagnosis not present

## 2021-03-20 DIAGNOSIS — E669 Obesity, unspecified: Secondary | ICD-10-CM | POA: Diagnosis not present

## 2021-03-27 DIAGNOSIS — A568 Sexually transmitted chlamydial infection of other sites: Secondary | ICD-10-CM | POA: Diagnosis not present

## 2021-03-27 DIAGNOSIS — Z3685 Encounter for antenatal screening for Streptococcus B: Secondary | ICD-10-CM | POA: Diagnosis not present

## 2021-03-27 DIAGNOSIS — Z3689 Encounter for other specified antenatal screening: Secondary | ICD-10-CM | POA: Diagnosis not present

## 2021-03-27 DIAGNOSIS — O98313 Other infections with a predominantly sexual mode of transmission complicating pregnancy, third trimester: Secondary | ICD-10-CM | POA: Diagnosis not present

## 2021-03-27 DIAGNOSIS — M549 Dorsalgia, unspecified: Secondary | ICD-10-CM | POA: Diagnosis not present

## 2021-03-27 DIAGNOSIS — Z3A36 36 weeks gestation of pregnancy: Secondary | ICD-10-CM | POA: Diagnosis not present

## 2021-03-27 DIAGNOSIS — O99891 Other specified diseases and conditions complicating pregnancy: Secondary | ICD-10-CM | POA: Diagnosis not present

## 2021-03-27 DIAGNOSIS — O26893 Other specified pregnancy related conditions, third trimester: Secondary | ICD-10-CM | POA: Diagnosis not present

## 2021-03-27 DIAGNOSIS — R102 Pelvic and perineal pain: Secondary | ICD-10-CM | POA: Diagnosis not present

## 2021-03-27 DIAGNOSIS — A749 Chlamydial infection, unspecified: Secondary | ICD-10-CM | POA: Diagnosis not present

## 2021-03-27 DIAGNOSIS — O98813 Other maternal infectious and parasitic diseases complicating pregnancy, third trimester: Secondary | ICD-10-CM | POA: Diagnosis not present

## 2021-04-18 ENCOUNTER — Other Ambulatory Visit: Payer: Self-pay

## 2021-04-18 ENCOUNTER — Encounter (HOSPITAL_COMMUNITY): Payer: Self-pay | Admitting: Obstetrics & Gynecology

## 2021-04-18 ENCOUNTER — Inpatient Hospital Stay (HOSPITAL_COMMUNITY): Payer: Medicaid Other | Admitting: Anesthesiology

## 2021-04-18 ENCOUNTER — Inpatient Hospital Stay (HOSPITAL_COMMUNITY)
Admission: AD | Admit: 2021-04-18 | Discharge: 2021-04-20 | DRG: 807 | Disposition: A | Discharge status: Home / Self Care | Payer: Medicaid Other | Attending: Obstetrics and Gynecology | Admitting: Obstetrics and Gynecology

## 2021-04-18 DIAGNOSIS — A568 Sexually transmitted chlamydial infection of other sites: Secondary | ICD-10-CM | POA: Diagnosis present

## 2021-04-18 DIAGNOSIS — R1084 Generalized abdominal pain: Secondary | ICD-10-CM | POA: Diagnosis not present

## 2021-04-18 DIAGNOSIS — Z3A40 40 weeks gestation of pregnancy: Secondary | ICD-10-CM | POA: Diagnosis not present

## 2021-04-18 DIAGNOSIS — D696 Thrombocytopenia, unspecified: Secondary | ICD-10-CM | POA: Diagnosis not present

## 2021-04-18 DIAGNOSIS — O98819 Other maternal infectious and parasitic diseases complicating pregnancy, unspecified trimester: Secondary | ICD-10-CM | POA: Diagnosis present

## 2021-04-18 DIAGNOSIS — A749 Chlamydial infection, unspecified: Secondary | ICD-10-CM | POA: Diagnosis present

## 2021-04-18 DIAGNOSIS — O4202 Full-term premature rupture of membranes, onset of labor within 24 hours of rupture: Secondary | ICD-10-CM | POA: Diagnosis not present

## 2021-04-18 DIAGNOSIS — O479 False labor, unspecified: Secondary | ICD-10-CM | POA: Diagnosis not present

## 2021-04-18 DIAGNOSIS — Z20822 Contact with and (suspected) exposure to covid-19: Secondary | ICD-10-CM | POA: Diagnosis present

## 2021-04-18 DIAGNOSIS — Z3403 Encounter for supervision of normal first pregnancy, third trimester: Secondary | ICD-10-CM

## 2021-04-18 DIAGNOSIS — O480 Post-term pregnancy: Secondary | ICD-10-CM | POA: Diagnosis not present

## 2021-04-18 DIAGNOSIS — O9912 Other diseases of the blood and blood-forming organs and certain disorders involving the immune mechanism complicating childbirth: Secondary | ICD-10-CM | POA: Diagnosis not present

## 2021-04-18 DIAGNOSIS — O9832 Other infections with a predominantly sexual mode of transmission complicating childbirth: Principal | ICD-10-CM | POA: Diagnosis present

## 2021-04-18 DIAGNOSIS — O26893 Other specified pregnancy related conditions, third trimester: Secondary | ICD-10-CM | POA: Diagnosis present

## 2021-04-18 LAB — CBC
HCT: 32.6 % — ABNORMAL LOW (ref 36.0–46.0)
Hemoglobin: 10.5 g/dL — ABNORMAL LOW (ref 12.0–15.0)
MCH: 27.8 pg (ref 26.0–34.0)
MCHC: 32.2 g/dL (ref 30.0–36.0)
MCV: 86.2 fL (ref 80.0–100.0)
Platelets: 142 10*3/uL — ABNORMAL LOW (ref 150–400)
RBC: 3.78 MIL/uL — ABNORMAL LOW (ref 3.87–5.11)
RDW: 13.4 % (ref 11.5–15.5)
WBC: 4.7 10*3/uL (ref 4.0–10.5)
nRBC: 0 % (ref 0.0–0.2)

## 2021-04-18 LAB — RESP PANEL BY RT-PCR (FLU A&B, COVID) ARPGX2
Influenza A by PCR: NEGATIVE
Influenza B by PCR: NEGATIVE
SARS Coronavirus 2 by RT PCR: NEGATIVE

## 2021-04-18 LAB — TYPE AND SCREEN
ABO/RH(D): O POS
Antibody Screen: NEGATIVE

## 2021-04-18 MED ORDER — OXYTOCIN-SODIUM CHLORIDE 30-0.9 UT/500ML-% IV SOLN
2.5000 [IU]/h | INTRAVENOUS | Status: DC
  Filled 2021-04-18: qty 500

## 2021-04-18 MED ORDER — SENNOSIDES-DOCUSATE SODIUM 8.6-50 MG PO TABS
2.0000 | ORAL_TABLET | ORAL | Status: DC
  Administered 2021-04-19 – 2021-04-20 (×2): 2 via ORAL
  Filled 2021-04-18 (×2): qty 2

## 2021-04-18 MED ORDER — IBUPROFEN 600 MG PO TABS
600.0000 mg | ORAL_TABLET | Freq: Four times a day (QID) | ORAL | Status: DC
  Administered 2021-04-18 – 2021-04-20 (×7): 600 mg via ORAL
  Filled 2021-04-18 (×7): qty 1

## 2021-04-18 MED ORDER — AZITHROMYCIN 250 MG PO TABS
1000.0000 mg | ORAL_TABLET | Freq: Once | ORAL | Status: AC
  Administered 2021-04-18: 1000 mg via ORAL
  Filled 2021-04-18: qty 4

## 2021-04-18 MED ORDER — FENTANYL-BUPIVACAINE-NACL 0.5-0.125-0.9 MG/250ML-% EP SOLN
EPIDURAL | Status: AC
  Filled 2021-04-18: qty 250

## 2021-04-18 MED ORDER — SIMETHICONE 80 MG PO CHEW
80.0000 mg | CHEWABLE_TABLET | ORAL | Status: DC | PRN
Start: 2021-04-18 — End: 2021-04-20

## 2021-04-18 MED ORDER — ACETAMINOPHEN 325 MG PO TABS: 650.0000 mg | ORAL_TABLET | ORAL | Status: DC | PRN

## 2021-04-18 MED ORDER — DIBUCAINE (PERIANAL) 1 % EX OINT
1.0000 | TOPICAL_OINTMENT | CUTANEOUS | Status: DC | PRN
Start: 2021-04-18 — End: 2021-04-20

## 2021-04-18 MED ORDER — PRENATAL MULTIVITAMIN CH
1.0000 | ORAL_TABLET | Freq: Every day | ORAL | Status: DC
Start: 2021-04-19 — End: 2021-04-20
  Administered 2021-04-19 – 2021-04-20 (×2): 1 via ORAL
  Filled 2021-04-18 (×2): qty 1

## 2021-04-18 MED ORDER — ONDANSETRON HCL 4 MG PO TABS: 4.0000 mg | ORAL_TABLET | ORAL | Status: DC | PRN

## 2021-04-18 MED ORDER — ONDANSETRON HCL 4 MG/2ML IJ SOLN
4.0000 mg | INTRAMUSCULAR | Status: DC | PRN
Start: 2021-04-18 — End: 2021-04-20

## 2021-04-18 MED ORDER — LIDOCAINE HCL (PF) 1 % IJ SOLN: 30.0000 mL | INTRAMUSCULAR | Status: DC | PRN

## 2021-04-18 MED ORDER — LACTATED RINGERS IV SOLN: 500.0000 mL | INTRAVENOUS | Status: DC | PRN

## 2021-04-18 MED ORDER — TERBUTALINE SULFATE 1 MG/ML IJ SOLN: 0.2500 mg | Freq: Once | INTRAMUSCULAR | Status: DC | PRN

## 2021-04-18 MED ORDER — MISOPROSTOL 200 MCG PO TABS
ORAL_TABLET | ORAL | Status: AC
  Filled 2021-04-18: qty 5

## 2021-04-18 MED ORDER — OXYTOCIN BOLUS FROM INFUSION
333.0000 mL | Freq: Once | INTRAVENOUS | Status: AC
  Administered 2021-04-18: 333 mL via INTRAVENOUS

## 2021-04-18 MED ORDER — FENTANYL CITRATE (PF) 100 MCG/2ML IJ SOLN
INTRAMUSCULAR | Status: AC
  Filled 2021-04-18: qty 2

## 2021-04-18 MED ORDER — DIPHENHYDRAMINE HCL 25 MG PO CAPS: 25.0000 mg | ORAL_CAPSULE | Freq: Four times a day (QID) | ORAL | Status: DC | PRN

## 2021-04-18 MED ORDER — LACTATED RINGERS IV SOLN: INTRAVENOUS | Status: DC

## 2021-04-18 MED ORDER — ZOLPIDEM TARTRATE 5 MG PO TABS: 5.0000 mg | ORAL_TABLET | Freq: Every evening | ORAL | Status: DC | PRN

## 2021-04-18 MED ORDER — FENTANYL-BUPIVACAINE-NACL 0.5-0.125-0.9 MG/250ML-% EP SOLN
EPIDURAL | Status: DC | PRN
  Administered 2021-04-18: 10.5 mL/h via EPIDURAL

## 2021-04-18 MED ORDER — OXYCODONE-ACETAMINOPHEN 5-325 MG PO TABS
2.0000 | ORAL_TABLET | ORAL | Status: DC | PRN
Start: 2021-04-18 — End: 2021-04-18

## 2021-04-18 MED ORDER — WITCH HAZEL-GLYCERIN EX PADS: 1.0000 "application " | MEDICATED_PAD | CUTANEOUS | Status: DC | PRN

## 2021-04-18 MED ORDER — FENTANYL CITRATE (PF) 100 MCG/2ML IJ SOLN
100.0000 ug | INTRAMUSCULAR | Status: DC | PRN
  Administered 2021-04-18: 100 ug via INTRAVENOUS

## 2021-04-18 MED ORDER — OXYCODONE-ACETAMINOPHEN 5-325 MG PO TABS: 1.0000 | ORAL_TABLET | ORAL | Status: DC | PRN

## 2021-04-18 MED ORDER — COCONUT OIL OIL: 1.0000 "application " | TOPICAL_OIL | Status: DC | PRN

## 2021-04-18 MED ORDER — ONDANSETRON HCL 4 MG/2ML IJ SOLN
4.0000 mg | Freq: Four times a day (QID) | INTRAMUSCULAR | Status: DC | PRN
  Administered 2021-04-18: 4 mg via INTRAVENOUS
  Filled 2021-04-18: qty 2

## 2021-04-18 MED ORDER — LIDOCAINE HCL (PF) 1 % IJ SOLN
INTRAMUSCULAR | Status: DC | PRN
  Administered 2021-04-18 (×2): 4 mL via EPIDURAL

## 2021-04-18 MED ORDER — MISOPROSTOL 200 MCG PO TABS
200.0000 ug | ORAL_TABLET | Freq: Once | ORAL | Status: AC
  Administered 2021-04-18: 200 ug via ORAL

## 2021-04-18 MED ORDER — BENZOCAINE-MENTHOL 20-0.5 % EX AERO
1.0000 "application " | INHALATION_SPRAY | CUTANEOUS | Status: DC | PRN
  Administered 2021-04-18: 1 via TOPICAL
  Filled 2021-04-18 (×2): qty 56

## 2021-04-18 MED ORDER — ACETAMINOPHEN 325 MG PO TABS
650.0000 mg | ORAL_TABLET | ORAL | Status: DC | PRN
  Administered 2021-04-19: 650 mg via ORAL
  Filled 2021-04-18: qty 2

## 2021-04-18 MED ORDER — SOD CITRATE-CITRIC ACID 500-334 MG/5ML PO SOLN: 30.0000 mL | ORAL | Status: DC | PRN

## 2021-04-18 MED ORDER — OXYTOCIN-SODIUM CHLORIDE 30-0.9 UT/500ML-% IV SOLN
1.0000 m[IU]/min | INTRAVENOUS | Status: DC
  Administered 2021-04-18: 2 m[IU]/min via INTRAVENOUS

## 2021-04-18 MED ORDER — MISOPROSTOL 200 MCG PO TABS
800.0000 ug | ORAL_TABLET | Freq: Once | ORAL | Status: AC
  Administered 2021-04-18: 800 ug via RECTAL

## 2021-04-18 MED ORDER — TRANEXAMIC ACID-NACL 1000-0.7 MG/100ML-% IV SOLN: 1000.0000 mg | Freq: Once | INTRAVENOUS | Status: AC

## 2021-04-18 MED ORDER — TETANUS-DIPHTH-ACELL PERTUSSIS 5-2.5-18.5 LF-MCG/0.5 IM SUSY
0.5000 mL | PREFILLED_SYRINGE | Freq: Once | INTRAMUSCULAR | Status: DC
Start: 2021-04-19 — End: 2021-04-20

## 2021-04-18 MED ORDER — TRANEXAMIC ACID-NACL 1000-0.7 MG/100ML-% IV SOLN
INTRAVENOUS | Status: AC
  Administered 2021-04-18: 1000 mg via INTRAVENOUS
  Filled 2021-04-18: qty 100

## 2021-04-18 NOTE — H&P (Addendum)
Amy Richard is a 25 y.o. female G1P0 at [redacted]w[redacted]d by LMP c/w 5w [place], presenting for SOL, with SROM in MAU at 10:30am.  Patient has had an uncomplicated pregnancy, aside from untreated chlamydial infection for the duration of pregnancy (patient has been non-compliant with obtaining medications due to cost, and was re-infected by partner).   Denies VB. LOF in MAU when grossly ruptured, no leaking fluid prior to arrival. +CTX began today, very strong and close together.  No prior medical issues. Pap 08/2020 was NILM. Labs normal, reviewed.  OB History     Gravida  1   Para      Term      Preterm      AB      Living         SAB      IAB      Ectopic      Multiple      Live Births             No past medical history on file. No past surgical history on file. Family History: family history is not on file. Social History:  reports that she has never smoked. She has never used smokeless tobacco. She reports that she does not drink alcohol and does not use drugs.     Maternal Diabetes: No Genetic Screening: Normal - cannot obtain due to in [facility] system, NT [place] normal. Maternal Ultrasounds/Referrals: Normal Fetal Ultrasounds or other Referrals:  None Maternal Substance Abuse:  No Significant Maternal Medications:  None Significant Maternal Lab Results:  Group B Strep negative and Other:  +Chlamydia, never treated. Other Comments:   Repetitive positive chlamydia infections, patient never obtained medicines and was reinfected after initial infection was treated in 1st trimester. Patient also has food and transportation insecurities.   Review of Systems  Constitutional:  Negative for chills and fever.  Respiratory:  Negative for cough and shortness of breath.   Cardiovascular:  Negative for chest pain.  Gastrointestinal:  Positive for abdominal pain (contractions).  Skin:  Negative for rash.  Neurological:  Negative for headaches.  History Dilation: 9 Effacement (%):  100 Station: 0 Exam by:: [name], CNM Blood pressure (!) 106/49, pulse 79, temperature 97.8 F (36.6 C), temperature source Oral, resp. rate 20, SpO2 100 %.   Fetal Exam Fetal Monitor Review: Baseline rate: 130bpm.  Variability: moderate (6-25 bpm).   Pattern: accelerations present and no decelerations.   Fetal State Assessment: Category I - tracings are normal.  Physical Exam Constitutional:      General: She is not in acute distress.    Appearance: She is well-developed.  HENT:     Head: Normocephalic and atraumatic.  Cardiovascular:     Rate and Rhythm: Normal rate.  Pulmonary:     Effort: Pulmonary effort is normal. No respiratory distress.  Musculoskeletal:        General: No tenderness.  Skin:    General: Skin is warm and dry.     Findings: No erythema or rash.  Neurological:     Mental Status: She is alert and oriented to person, place, and time.    Prenatal labs: ABO, Rh: --/--/PENDING (07/19 1038) Antibody: PENDING (07/19 1038) Rubella:   RPR: Nonreactive (05/02 0000)  HBsAg: Negative (12/14 0000)  HIV: Non-reactive (05/02 0000)  GBS: Negative/-- (05/02 0000)   Assessment/Plan: Amy Richard is a 25 y.o. G1P0 at [redacted]w[redacted]d by LMP c/w 5w [place].  #Labor:  Expectant management, SROM.  #Pain: May have  epidural if she desires. #FWB:  Cat I #ID:  GBS Neg  #MOF: Breast #MOC: IUD after CT infection cleared #CIRC: Desires for son #Food/Transportation insecurities: Social work consult for assistance #Social: FOB on house arrest, plans to visit with curfew 6pm.   Jen Mow, DO 04/18/2021, 11:28 AM

## 2021-04-18 NOTE — Discharge Summary (Signed)
Postpartum Discharge Summary      Patient Name: Amy Richard DOB: 21-Sep-1996 MRN: [id number]  Date of admission: 04/18/2021 Delivery date:04/18/2021  Delivering provider: Isaias Sakai  Date of discharge: 04/20/2021  Admitting diagnosis: Encounter for vaginal delivery [O80] Intrauterine pregnancy: [redacted]w[redacted]d    Secondary diagnosis:  Principal Problem:   Vaginal delivery Active Problems:   Encounter for vaginal delivery   Obstetrical laceration   Chlamydia infection affecting pregnancy   Encounter for supervision of normal first pregnancy in third trimester  Additional problems: Chlamydial infection    Discharge diagnosis: Term Pregnancy Delivered and chlamydia infection                                               Post partum procedures: Azithromycin treatment Augmentation: Pitocin Complications: None  Hospital course: Onset of Labor With Vaginal Delivery      25y.o. yo G1P0 at 433w2das admitted in Active Labor on 04/18/2021. Patient had an uncomplicated labor course as follows:  Membrane Rupture Time/Date: 10:30 AM ,04/18/2021   Delivery Method:Vaginal, Spontaneous  Episiotomy: None  Lacerations:   clitoral Patient had an uncomplicated postpartum course.  She is ambulating, tolerating a regular diet, passing flatus, and urinating well. She had a SW consult due to some social needs. Patient is discharged home in stable condition on 04/20/21.  Newborn Data: Birth date:04/18/2021  Birth time:5:24 PM  Gender:Female  Living status:Living  Apgars:8 ,9  Weight:3099 g (6lb 13.3oz)  Magnesium Sulfate received: No BMZ received: No Rhophylac:N/A MMR:N/A T-DaP:Given prenatally Flu: N/A Transfusion:No  Physical exam  Vitals:   04/19/21 0918 04/19/21 1459 04/19/21 2307 04/20/21 0508  BP: 114/77 102/67 108/63 100/60  Pulse: 86 (!) 113 80 78  Resp: 17 17 18 18   Temp: 98 F (36.7 C) 98.7 F (37.1 C) 98.2 F (36.8 C) 97.7 F (36.5 C)  TempSrc: Oral Axillary  Oral Oral  SpO2:  100%    Weight:      Height:       General: alert and cooperative Lochia: appropriate Uterine Fundus: firm Incision: N/A DVT Evaluation: No evidence of DVT seen on physical exam. Labs: Lab Results  Component Value Date   WBC 12.4 (H) 04/19/2021   HGB 9.0 (L) 04/19/2021   HCT 27.3 (L) 04/19/2021   MCV 87.5 04/19/2021   PLT 148 (L) 04/19/2021   CMP Latest Ref Rng & Units 09/25/2019  Glucose 70 - 99 mg/dL 101(H)  BUN 6 - 20 mg/dL 8  Creatinine 0.44 - 1.00 mg/dL 0.55  Sodium 135 - 145 mmol/L 137  Potassium 3.5 - 5.1 mmol/L 3.5  Chloride 98 - 111 mmol/L 105  CO2 22 - 32 mmol/L 23  Calcium 8.9 - 10.3 mg/dL 9.5  Total Protein 6.5 - 8.1 g/dL -  Total Bilirubin 0.3 - 1.2 mg/dL -  Alkaline Phos 38 - 126 U/L -  AST 15 - 41 U/L -  ALT 0 - 44 U/L -   Edinburgh Score: Edinburgh Postnatal Depression Scale Screening Tool 04/18/2021  I have been able to laugh and see the funny side of things. (No Data)     After visit meds:  Allergies as of 04/20/2021       Reactions   Shrimp (diagnostic) Hives   Strawberry (diagnostic) Hives        Medication List  STOP taking these medications    cyclobenzaprine 10 MG tablet Commonly known as: FLEXERIL   fluconazole 150 MG tablet Commonly known as: Diflucan   Guaifenesin 1200 MG Tb12   Lo Loestrin Fe 1 MG-10 MCG / 10 MCG tablet Generic drug: Norethindrone-Ethinyl Estradiol-Fe Biphas   metroNIDAZOLE 500 MG tablet Commonly known as: Flagyl   promethazine-dextromethorphan 6.25-15 MG/5ML syrup Commonly known as: PROMETHAZINE-DM       TAKE these medications    cholecalciferol 25 MCG (1000 UNIT) tablet Commonly known as: VITAMIN D3 Take 1,000 Units by mouth daily.   ibuprofen 600 MG tablet Commonly known as: ADVIL Take 1 tablet (600 mg total) by mouth every 6 (six) hours as needed. What changed: Another medication with the same name was removed. Continue taking this medication, and follow the  directions you see here.         Discharge home in stable condition Infant Feeding: Breast Infant Disposition:home with mother Discharge instruction: per After Visit Summary and Postpartum booklet. Activity: Advance as tolerated. Pelvic rest for 6 weeks.  Diet: routine diet Future Appointments:No future appointments.   Follow up Visit:  [facility]. Schedule an appointment as soon as possible for a visit in 4 week(s).   Why: For follow up delivery Contact information: [facility]   [address]   [place]   [place], [place] [address]   [phone]                Please schedule this patient for a In person postpartum visit in 4 weeks with the following provider: Any provider at prenatal clinic. Additional Postpartum F/U: Test of cure for chlamydia   Low risk pregnancy complicated by:  chlamydia infection for entire pregnancy and during delivery. Delivery mode:  Vaginal, Spontaneous  Anticipated Birth Control:  IUD, when TOC is negative.   04/20/2021 Myrtis Ser, CNM 10:04 AM

## 2021-04-18 NOTE — Progress Notes (Signed)
Labor Progress Note Amy Richard is a 25 y.o. G1P0 at [redacted]w[redacted]d presented for SOL/SROM. S: Patient comfortable with epidural, not feeling pressure.   O:  BP 113/69   Pulse 66   Temp 97.8 F (36.6 C) (Oral)   Resp 18   Ht 4\' 11"  (1.499 m)   Wt 68 kg   SpO2 99%   BMI 30.30 kg/m  EFM: 125 bpm/mod var/+accels, no decels  CVE: Dilation: 9 Effacement (%): 100 Station: -1, 0 Presentation: Vertex Exam by:: Dr [email or web address]   A&P: 25 y.o. G1P0 [redacted]w[redacted]d here for SOL/SROM. #Labor: Progressing well. Anterior lip, high fowlers position. #Pain: Epidural #FWB: Cat I #GBS negative #CT: positive, s/p 1g Azithro PO  [redacted]w[redacted]d, DO 2:41 PM

## 2021-04-18 NOTE — Anesthesia Preprocedure Evaluation (Signed)
Anesthesia Evaluation  Patient identified by MRN, date of birth, ID band Patient awake    Reviewed: Allergy & Precautions, NPO status , Patient's Chart, lab work & pertinent test results  Airway Mallampati: II  TM Distance: >3 FB Neck ROM: Full    Dental no notable dental hx. (+) Teeth Intact, Dental Advisory Given   Pulmonary neg pulmonary ROS,    Pulmonary exam normal breath sounds clear to auscultation       Cardiovascular negative cardio ROS Normal cardiovascular exam Rhythm:Regular Rate:Normal     Neuro/Psych negative neurological ROS  negative psych ROS   GI/Hepatic Neg liver ROS, GERD  ,  Endo/Other  negative endocrine ROS  Renal/GU negative Renal ROS  negative genitourinary   Musculoskeletal negative musculoskeletal ROS (+)   Abdominal   Peds  Hematology  (+) anemia , Thrombocytopenia-mild    Anesthesia Other Findings   Reproductive/Obstetrics (+) Pregnancy                             Anesthesia Physical Anesthesia Plan  ASA: 2  Anesthesia Plan: Epidural   Post-op Pain Management:    Induction:   PONV Risk Score and Plan:   Airway Management Planned: Natural Airway  Additional Equipment:   Intra-op Plan:   Post-operative Plan:   Informed Consent: I have reviewed the patients History and Physical, chart, labs and discussed the procedure including the risks, benefits and alternatives for the proposed anesthesia with the patient or authorized representative who has indicated his/her understanding and acceptance.       Plan Discussed with: Anesthesiologist  Anesthesia Plan Comments:         Anesthesia Quick Evaluation

## 2021-04-18 NOTE — MAU Note (Signed)
...  Amy Richard is a 25 y.o. at [redacted]w[redacted]d here in MAU via EMS reporting: CTX that began "earlier this morning." Patient unable to engage in conversation due to pain due to contractions. Patient states she saw "light bleeding" and "some fluids" while at home but unable to endorse a time. [name], CNM, at bedside. CNM performed SVE and patient found to be ruptured at 6100-2 and vertex. Moderate amount of clear fluid noted in patient bed. +FM. Dr. Omer Jack notified but Dr. Adrian Blackwater, DO, answered phone. Report given and DO placed admission orders. L&D Charge Nurse notified and room 216 given for patient.   Pain score: 10/10 lower abdominal Vitals:   04/18/21 1031  BP: (!) 107/91  Pulse: 85  Resp: 20  Temp: 97.6 F (36.4 C)  SpO2: 100%

## 2021-04-18 NOTE — Plan of Care (Signed)
Archana Lane-Headen is a 25 y.o. G1P1 now, s/p NSVD. S: Called back to room due to continued blood clots and trickling bleeding. QBL: 150cc at delivery, 198cc expressed, further clots and trickling. Uterus has intermittent bogginess but below umbilicus and remains firm  O:  BP (!) 114/97   Pulse 78   Temp (!) 97.1 F (36.2 C) (Axillary)   Resp 18   Ht 4\' 11"  (1.499 m)   Wt 68 kg   SpO2 99%   BMI 30.30 kg/m  QBL 198 plus unweighed pad with clots (visually looks like 100-150cc) QBL Delivery: 150cc TOTAL QBL ~500cc  A&P: Fundus firm and 2+ below umbilicus after massage and evacuation of clots in LUS and vagina.  Clitoral laceration hemostatic Foley catheter in place S/P 1g TXA and Cytotec PO + 800 mcg Cytotec PR Will re-evaluate at next check.    , DO 7:16 PM

## 2021-04-18 NOTE — Anesthesia Procedure Notes (Signed)
Epidural Patient location during procedure: OB Start time: 04/18/2021 11:47 AM End time: 04/18/2021 11:56 AM  Preanesthetic Checklist Completed: patient identified, IV checked, site marked, risks and benefits discussed, surgical consent, monitors and equipment checked, pre-op evaluation and timeout performed  Epidural Patient position: sitting Prep: DuraPrep and site prepped and draped Patient monitoring: continuous pulse ox and blood pressure Approach: midline Location: L3-L4 Injection technique: LOR air  Needle:  Needle type: Tuohy  Needle gauge: 17 G Needle length: 9 cm and 9 Needle insertion depth: 5 cm Catheter type: closed end flexible Catheter size: 19 Gauge Catheter at skin depth: 10 cm Test dose: negative and Other  Assessment Events: blood not aspirated, injection not painful, no injection resistance, no paresthesia and negative IV test  Additional Notes Patient identified. Risks and benefits discussed including failed block, incomplete  Pain control, post dural puncture headache, nerve damage, paralysis, blood pressure Changes, nausea, vomiting, reactions to medications-both toxic and allergic and post Partum back pain. All questions were answered. Patient expressed understanding and wished to proceed. Sterile technique was used throughout procedure. Epidural site was Dressed with sterile barrier dressing. No paresthesias, signs of intravascular injection Or signs of intrathecal spread were encountered.  Patient was more comfortable after the epidural was dosed. Please see RN's note for documentation of vital signs and FHR which are stable. Reason for block:procedure for pain

## 2021-04-18 NOTE — Lactation Note (Addendum)
This note was copied from a baby's chart. Lactation Consultation Note  Patient Name: Amy Richard Today's Date: 04/18/2021   Age: < 1hr  LC came to assist with latching. Mom being assessed from nursing staff with vaginal bleeding. LC to return once mother is more stable.  LC returned to assist with latching infant at breast in cross cradle on right breast. Mom mentioned a quarter plug on top of her left breast. LC to provided a hand pump or work on latching on the floor to see if can move milk plug. LC could not latch in LD on this side due to placement of IV and Mom had shakes following episode earlier.  Maternal Data    Feeding    LATCH Score                    Lactation Tools Discussed/Used    Interventions    Discharge    Consult Status      Amy Seier  Richard 04/18/2021, 6:28 PM

## 2021-04-19 LAB — CBC WITH DIFFERENTIAL/PLATELET
Abs Immature Granulocytes: 0.06 10*3/uL (ref 0.00–0.07)
Basophils Absolute: 0 10*3/uL (ref 0.0–0.1)
Basophils Relative: 0 %
Eosinophils Absolute: 0 10*3/uL (ref 0.0–0.5)
Eosinophils Relative: 0 %
HCT: 27.3 % — ABNORMAL LOW (ref 36.0–46.0)
Hemoglobin: 9 g/dL — ABNORMAL LOW (ref 12.0–15.0)
Immature Granulocytes: 1 %
Lymphocytes Relative: 9 %
Lymphs Abs: 1.2 10*3/uL (ref 0.7–4.0)
MCH: 28.8 pg (ref 26.0–34.0)
MCHC: 33 g/dL (ref 30.0–36.0)
MCV: 87.5 fL (ref 80.0–100.0)
Monocytes Absolute: 0.9 10*3/uL (ref 0.1–1.0)
Monocytes Relative: 7 %
Neutro Abs: 10.3 10*3/uL — ABNORMAL HIGH (ref 1.7–7.7)
Neutrophils Relative %: 83 %
Platelets: 148 10*3/uL — ABNORMAL LOW (ref 150–400)
RBC: 3.12 MIL/uL — ABNORMAL LOW (ref 3.87–5.11)
RDW: 13.4 % (ref 11.5–15.5)
WBC: 12.4 10*3/uL — ABNORMAL HIGH (ref 4.0–10.5)
nRBC: 0 % (ref 0.0–0.2)

## 2021-04-19 LAB — RPR: RPR Ser Ql: NONREACTIVE

## 2021-04-19 MED ORDER — ASCORBIC ACID 500 MG PO TABS
250.0000 mg | ORAL_TABLET | ORAL | Status: DC
  Administered 2021-04-19: 250 mg via ORAL
  Filled 2021-04-19: qty 1

## 2021-04-19 MED ORDER — FERROUS SULFATE 325 (65 FE) MG PO TABS
325.0000 mg | ORAL_TABLET | ORAL | Status: DC
  Administered 2021-04-19: 325 mg via ORAL
  Filled 2021-04-19: qty 1

## 2021-04-19 MED ORDER — SODIUM CHLORIDE 0.9 % IV SOLN
500.0000 mg | Freq: Once | INTRAVENOUS | Status: AC
  Administered 2021-04-19: 500 mg via INTRAVENOUS
  Filled 2021-04-19: qty 25

## 2021-04-19 NOTE — Progress Notes (Addendum)
Post Partum Day 1 Subjective: Amy Richard reports feeling well today. She has minimal crampy lower abdominal pain which is well managed with her medications now. Her foley was removed early this morning and she has not voided independently yet. She has not passed flatus or had a bowel movement. She has not gotten up independently yet. She wants an IUD.    Objective: Blood pressure 99/61, pulse 86, temperature 98.3 F (36.8 C), temperature source Oral, resp. rate 18, height 4\' 11"  (1.499 m), weight 68 kg, SpO2 100 %, unknown if currently breastfeeding.  Physical Exam:  General: alert and no distress Lochia: appropriate Uterine Fundus: firm Incision: N/A DVT Evaluation: No evidence of DVT seen on physical exam. No cords or calf tenderness. No significant calf/ankle edema.  Recent Labs    04/18/21 1041 04/19/21 0458  HGB 10.5* 9.0*  HCT 32.6* 27.3*    Assessment/Plan: PPD #1  - Breastfeeding  - Circumcision consent form needs to be signed  - Contraception: wants IUD outpatient but unsure if she wants copper or hormonal  - Hgb at 9.0 today, IV Venofer ordered and PO iron will be given as well. Patient had EBL of 04/21/21 and was given 1g TXA and Cytotec PO + Cytotec PR.   Disposition: Patient will likely be discharged tomorrow.    LOS: 1 day   04/19/2021, 7:52 AM   Midwife attestation I have seen and examined this patient and agree with above documentation in the student's note.   Post Partum Day 1  Amy Richard is a 25 y.o. G1P1001 s/p SVD.  Pt denies problems with ambulating, voiding or po intake. Pain is well controlled. Method of Feeding: breast  PE:  Gen: well appearing Heart: reg rate Lungs: normal WOB Fundus firm Ext: soft, no pain, no edema  Assessment: S/p SVD PPD #1 Chlamydia>treated yesterday  Plan for discharge: tomorrow SW consult for food/transportation difficulties  30, CNM 12:29 PM

## 2021-04-19 NOTE — Anesthesia Postprocedure Evaluation (Signed)
Anesthesia Post Note  Patient: Amy Richard  Procedure(s) Performed: AN AD HOC LABOR EPIDURAL     Patient location during evaluation: Mother Baby Anesthesia Type: Epidural Level of consciousness: awake and alert Pain management: pain level controlled Vital Signs Assessment: post-procedure vital signs reviewed and stable Respiratory status: spontaneous breathing, nonlabored ventilation and respiratory function stable Cardiovascular status: stable Postop Assessment: no headache, no backache and epidural receding Anesthetic complications: no   No notable events documented.  Last Vitals:  Vitals:   04/19/21 0510 04/19/21 0918  BP: 99/61 114/77  Pulse: 86 86  Resp: 18 17  Temp: 36.8 C 36.7 C  SpO2: 100%     Last Pain:  Vitals:   04/19/21 0918  TempSrc: Oral  PainSc: 0-No pain   Pain Goal: Patients Stated Pain Goal: 3 (04/19/21 0510)              Epidural/Spinal Function Cutaneous sensation: Normal sensation (04/19/21 0918), Patient able to flex knees: Yes (04/19/21 2440), Patient able to lift hips off bed: Yes (04/19/21 0918), Back pain beyond tenderness at insertion site: No (04/19/21 0918), Progressively worsening motor and/or sensory loss: No (04/19/21 0918), Bowel and/or bladder incontinence post epidural: No (04/19/21 0918)  [name] N

## 2021-04-19 NOTE — Social Work (Signed)
CSW consulted for transportation and food insecurity. CSW reviewed chart and noted a diagnosis of anxiety and depression.   CSW met with MOB to complete assessment and offer support. CSW observed MOB feeding infant 'Zayden'. CSW offered to return, however MOB declined and was welcoming of visit. CSW informed MOB of reason for consult and assessed current emotions. MOB smiled as she stated her hormones are balancing out and she is "feeling really good now." CSW inquired on MOB pregnancy. MOB shared the pregnancy was a little rough, due to being hormonal. CSW discussed MOB mental health history. MOB disclosed she was diagnosed with anxiety and depression in 2017 or 2018. MOB shared she has experienced a lot of anxiety and some depressive episodes in the past. MOB stated she felt overwhelmed as recent as two days ago, due to her vehicle breaking down. CSW asked MOB if she has coping mechanisms to address the symptoms. MOB stated she utilizes "grounding" techniques to cope. MOB reported she was connected with a therapist during her second trimester, however over time the therapist stopped reaching out. MOB reported the therapy was somewhat helpful and it's about finding the right person. MOB has never been on medication to treat diagnosis. CSW noted MOB experienced suicidal ideations in 2020 and asked MOB if she has had any SI since that time. MOB reported she has not had any SI since that time. MOB was receptive to mental health resources and stated she feels comfortable seeking professional help if needs arise. MOB identified her mother and aunt as primary supports. MOB denies any current SI, HI or being involved in DV.   CSW provided education regarding the baby blues period versus PPD and provided resources. CSW provided the New Mom Checklist and encouraged MOB to self evaluate and contact a medical professional if symptoms are noted at any time.   CSW provided review of Sudden Infant Death Syndrome (SIDS)  precautions. MOB reported she has all essentials for infant, including a packn'play and car seat. MOB receives both [facility] and food stamp resources. MOB reported she has basic food needs being met and does not need additional food assistance. MOB is still in the process of identifying a pediatrician. CSW inquired on transportation barriers. MOB stated she had a car up until two days ago. MOB reported she is unsure if she will need transportation to infant's follow-up appointment, however she will notify staff if she needs [facility] set-up. MOB stated her mother will be providing transportation at discharge. MOB is currently enrolled in the [facility] program, which has provided support during pregnancy and will continue postpartum. MOB denies having any additional needs at this time.  CSW identifies no further need for intervention and no barriers to discharge at this time.  Darra Lis, [employer] [phone]

## 2021-04-19 NOTE — Lactation Note (Signed)
This note was copied from a baby's chart. Lactation Consultation Note  Patient Name: Amy Richard XENMM'H Date: 04/19/2021 Reason for consult: Follow-up assessment Age:24 hours  P1, Amy Richard states she started having a hard area on her L breast in the first few weeks of pregnancy.  Imaging was done and it is suspected to be a plugged milk duct.  Amy Richard states MD told her they would hope with breastfeeding it would resolve.  Assisted with latching baby on L breast adding massage and breast compression. Baby sleepy at breast with long pauses between sucking bursts.   Feed on demand with cues.  Goal 8-12+ times per day after first 24 hrs.  Place baby STS if not cueing.  Mom made aware of O/P services, breastfeeding support groups,  and our phone # for post-discharge questions.     Maternal Data Has patient been taught Hand Expression?: Yes  Feeding Amy Richard's Current Feeding Choice: Breast Milk  LATCH Score Latch: Grasps breast easily, tongue down, lips flanged, rhythmical sucking.  Audible Swallowing: A few with stimulation  Type of Nipple: Everted at rest and after stimulation  Comfort (Breast/Nipple): Soft / non-tender  Hold (Positioning): Assistance needed to correctly position infant at breast and maintain latch.  LATCH Score: 8     Interventions Interventions: Breast feeding basics reviewed;Assisted with latch;Hand express;Position options;Education      Consult Status Consult Status: Follow-up Date: 04/20/21 Follow-up type: In-patient    Dahlia Byes [facility] RN IBCLC 04/19/2021, 9:28 AM

## 2021-04-20 MED ORDER — IBUPROFEN 600 MG PO TABS: 600.0000 mg | ORAL_TABLET | Freq: Four times a day (QID) | ORAL | 0 refills | Status: AC | PRN

## 2021-04-20 NOTE — Lactation Note (Signed)
This note was copied from a baby's chart. Lactation Consultation Note  Patient Name: Amy Richard [id number] Date: 04/20/2021 Reason for consult: Follow-up assessment Age:24 hours LC reviewed the doc flow sheets with mom and updated.  Per mom baby had recently fed at 10:30 am - 15 mins with swallows.  Per mom still having difficulty positioning baby on the left breast.  Baby awake and fussy. LC changed 2 wets and 1 stool.  Placed baby STS on the left / football / baby latched easily with wide open mouth and LC assisted to flip upper lip and ease chin to increase depth.  Baby fed 12 mins with multiple swallows / increased with breast compressions.  The large nodule mid section of the breast ( size of 1/2 dollar ) decreased in size with massage, and compressions with the feeding. Per mom comfortable with the entire feeding.  LC reviewed the importance of prevention of engorgement and tx. , denies sore nipples. Mom aware of the [facility] resources/ has the brochure with phone numbers.  Per mom will be going to the [facility] center. LC discussed F/U with Effie Shy, IBCLC within the week. LC recommended when she is at her appt. Tomorrow to ask to make appt. The follow week .   Maternal Data Has patient been taught Hand Expression?: Yes  Feeding Mother's Current Feeding Choice: Breast Milk  LATCH Score Latch: Grasps breast easily, tongue down, lips flanged, rhythmical sucking.  Audible Swallowing: Spontaneous and intermittent  Type of Nipple: Everted at rest and after stimulation  Comfort (Breast/Nipple): Soft / non-tender  Hold (Positioning): Assistance needed to correctly position infant at breast and maintain latch.  LATCH Score: 9   Lactation Tools Discussed/Used Tools: Pump;Flanges Flange Size: 24;27 Breast pump type: Manual Pump Education: Setup, frequency, and cleaning;Milk Storage Reason for Pumping: PRN  Interventions Interventions: Breast feeding basics  reviewed;Assisted with latch;Skin to skin;Breast massage;Hand express;Breast compression;Adjust position;Support pillows;Position options;Hand pump;Education  Discharge Discharge Education: Engorgement and breast care;Warning signs for feeding baby WIC Program: Yes  Consult Status Consult Status: Complete Date: 04/20/21    Kathrin Greathouse 04/20/2021, 12:53 PM

## 2021-04-20 NOTE — Progress Notes (Signed)
CSW met with MOB at [facility] bedside. When arrived MOB was bonding with infant as evidence by engaging in skin to skin  and breast feeding. MOB and infant appeared happy and comfortable. CSW reviewed the [place] assessment and MOB reported that she answered the questions incorrectly (MOB did not answer based on her emotions over the last 7 days).  MOB was re-screened and Edinburgh score was an 8.  CSW provided PMAD education and assessed for safety.  MOB denied SI, HI, and DV.  CSW was receptive to outpatient counseling resources (local community agency list was provided to [facility]).  CSW also provided MOB with a pack'n play and reviewed safe sleep.  RN was updated.   There are no barriers to discharge.  Amy Richard, MSW, LCSW Clinical Social Work [phone]

## 2021-04-29 ENCOUNTER — Telehealth (HOSPITAL_COMMUNITY): Payer: Self-pay

## 2021-04-29 DIAGNOSIS — Z1331 Encounter for screening for depression: Secondary | ICD-10-CM

## 2021-04-29 NOTE — Telephone Encounter (Signed)
No answer. Left message to return nurse call.  Hospital EPDS score was 11. Referral placed for integrated behavioral health. Dr. Scheryl Darter called and made aware of hospital EPDS score and referral.   Heron Nay 04/29/2021,0959

## 2021-05-29 ENCOUNTER — Telehealth: Payer: Self-pay | Admitting: Clinical

## 2021-05-29 NOTE — Telephone Encounter (Signed)
Left HIPPA-compliant message to call back Angele Wiemann from [facility] at  [phone] (Mylia Pondexter's office); left MyChart message for pt.  

## 2021-06-24 DIAGNOSIS — S71111A Laceration without foreign body, right thigh, initial encounter: Secondary | ICD-10-CM | POA: Diagnosis not present

## 2021-06-30 DIAGNOSIS — Z3009 Encounter for other general counseling and advice on contraception: Secondary | ICD-10-CM | POA: Diagnosis not present

## 2021-06-30 DIAGNOSIS — A749 Chlamydial infection, unspecified: Secondary | ICD-10-CM | POA: Diagnosis not present

## 2021-07-13 DIAGNOSIS — Z20822 Contact with and (suspected) exposure to covid-19: Secondary | ICD-10-CM | POA: Diagnosis not present

## 2021-12-02 DIAGNOSIS — K2970 Gastritis, unspecified, without bleeding: Secondary | ICD-10-CM | POA: Diagnosis not present

## 2021-12-02 DIAGNOSIS — R109 Unspecified abdominal pain: Secondary | ICD-10-CM | POA: Diagnosis not present

## 2022-04-24 DIAGNOSIS — R079 Chest pain, unspecified: Secondary | ICD-10-CM | POA: Diagnosis not present

## 2022-04-24 DIAGNOSIS — M79662 Pain in left lower leg: Secondary | ICD-10-CM | POA: Diagnosis not present

## 2022-04-24 DIAGNOSIS — I959 Hypotension, unspecified: Secondary | ICD-10-CM | POA: Diagnosis not present

## 2022-04-24 DIAGNOSIS — M79661 Pain in right lower leg: Secondary | ICD-10-CM | POA: Diagnosis not present

## 2022-04-24 DIAGNOSIS — R0789 Other chest pain: Secondary | ICD-10-CM | POA: Diagnosis not present

## 2022-06-09 DIAGNOSIS — H5213 Myopia, bilateral: Secondary | ICD-10-CM | POA: Diagnosis not present

## 2022-07-17 DIAGNOSIS — H5213 Myopia, bilateral: Secondary | ICD-10-CM | POA: Diagnosis not present

## 2022-07-17 DIAGNOSIS — H52223 Regular astigmatism, bilateral: Secondary | ICD-10-CM | POA: Diagnosis not present

## 2023-03-26 DIAGNOSIS — M25571 Pain in right ankle and joints of right foot: Secondary | ICD-10-CM | POA: Diagnosis not present

## 2023-03-26 DIAGNOSIS — S99919A Unspecified injury of unspecified ankle, initial encounter: Secondary | ICD-10-CM | POA: Diagnosis not present

## 2023-03-26 DIAGNOSIS — W19XXXA Unspecified fall, initial encounter: Secondary | ICD-10-CM | POA: Diagnosis not present

## 2023-03-26 DIAGNOSIS — I959 Hypotension, unspecified: Secondary | ICD-10-CM | POA: Diagnosis not present

## 2023-03-26 DIAGNOSIS — S93401A Sprain of unspecified ligament of right ankle, initial encounter: Secondary | ICD-10-CM | POA: Diagnosis not present
